# Patient Record
Sex: Male | Born: 1970 | Race: Black or African American | Hispanic: No | Marital: Married | Smoking: Never smoker
Health system: Southern US, Community
[De-identification: ages and names within clinical notes are randomized; demographics above are authoritative.]

## PROBLEM LIST (undated history)

## (undated) DIAGNOSIS — E785 Hyperlipidemia, unspecified: Secondary | ICD-10-CM

## (undated) DIAGNOSIS — I10 Essential (primary) hypertension: Secondary | ICD-10-CM

## (undated) HISTORY — DX: Hyperlipidemia, unspecified: E78.5

## (undated) HISTORY — DX: Essential (primary) hypertension: I10

---

## 2009-10-02 HISTORY — PX: LAPAROSCOPIC GASTRIC BANDING WITH HIATAL HERNIA REPAIR: SHX6351

## 2013-10-19 ENCOUNTER — Encounter (INDEPENDENT_AMBULATORY_CARE_PROVIDER_SITE_OTHER): Payer: Self-pay

## 2013-10-19 ENCOUNTER — Ambulatory Visit (INDEPENDENT_AMBULATORY_CARE_PROVIDER_SITE_OTHER): Payer: BC Managed Care – PPO | Admitting: General Surgery

## 2013-10-19 ENCOUNTER — Encounter (INDEPENDENT_AMBULATORY_CARE_PROVIDER_SITE_OTHER): Payer: Self-pay | Admitting: General Surgery

## 2013-10-19 VITALS — BP 128/72 | HR 70 | Temp 98.0°F | Resp 18 | Ht 72.0 in | Wt 262.2 lb

## 2013-10-19 DIAGNOSIS — Z9884 Bariatric surgery status: Secondary | ICD-10-CM

## 2013-10-19 DIAGNOSIS — Z6838 Body mass index (BMI) 38.0-38.9, adult: Secondary | ICD-10-CM

## 2013-10-19 DIAGNOSIS — I10 Essential (primary) hypertension: Secondary | ICD-10-CM

## 2013-10-19 DIAGNOSIS — E669 Obesity, unspecified: Secondary | ICD-10-CM

## 2013-10-19 DIAGNOSIS — Z4651 Encounter for fitting and adjustment of gastric lap band: Secondary | ICD-10-CM

## 2013-10-19 NOTE — Patient Instructions (Signed)
1. Stay on liquids for the next 2 days as you adapt to your new fill volume.  Then resume your previous diet. 2. Decreasing your carbohydrate intake will hasten you weight loss.  Rely more on proteins for your meals.  Avoid condiments that contain sweets such as Honey Mustard and sugary salad dressings.   3. Stay in the "green zone".  If you are regurgitating with meals, having night time reflux, and find yourself eating soft comfort foods (mashed potatoes, potato chips)...realize that you are developing "maladaptive eating".  You will not lose weight this way and may regain weight.  The GREEN ZONE is eating smaller portions and not regurgitating.  Hence we may need to withdraw fluid from your band. 4. Build exercise into your daily routine.  Walking is the best way to start but do something every day if you can.    Eating techniques 20-20-20 (30-30-30) 20 chews, 20 seconds between bites of food, 20 minutes to eat; sometimes you may need 30 chews, 30 seconds etc Use your nondominant hand to eat with Use a child/infant size utensil Try not to eat while watching TV Put fork down between bites of food Bites should be size of pinky finger

## 2013-10-20 NOTE — Progress Notes (Signed)
Patient ID: Antonio Thomas, male   DOB: April 15, 1971, 42 y.o.   MRN: [id number]  Chief Complaint  Patient presents with  . Bariatric Pre-op    Lap band    HPI Antonio Thomas is a 42 y.o. male.   HPI 42 year old African American male who underwent laparoscopic adjustable gastric band placement of a Realize band as well as a small hiatal hernia repair in November 2010 presents today for transfer of his bariatric care. He had a procedure done at [facility] by Dr. Clent Ridges. About a year later he states that he had a port problem and had to have a revision of his port. His last visit at his prior bariatric surgeon's office was in March 2013. His highest preoperative weight was 292 pounds. His weight at his last doctor's appointment in March 2013 261 pounds. His preoperative comorbidities included hypertension and hyperlipidemia. He states that overall he has been doing well. His lowest weight that he got down to after surgery was 247 pounds.  He states that currently he is not that active. He is not exercising on a regular basis. He is not taking a multivitamin. He denies any abdominal pain. He denies any reflux. He denies any regurgitation. He denies any nighttime cough. He states that the bites of food that he takes are about the size of a quarter. He only waits a few seconds between bites of food. He states that he can eat large portions of food. Past Medical History  Diagnosis Date  . Hypertension   . Hyperlipidemia     Past Surgical History  Procedure Laterality Date  . Laparoscopic gastric banding with hiatal hernia repair  10/02/09    Dr Clent Ridges; [place]; Realize band    Family History  Problem Relation Age of Onset  . Diabetes Mother   . Cancer Father     prostate  Renal failure in his brothers  Social History History  Substance Use Topics  . Smoking status: Never Smoker   . Smokeless tobacco: Not on file  . Alcohol Use: Not on file    No  Known Allergies  Current Outpatient Prescriptions  Medication Sig Dispense Refill  . diltiazem (CARDIZEM CD) 240 MG 24 hr capsule       . diltiazem (DILACOR XR) 240 MG 24 hr capsule       . simvastatin (ZOCOR) 20 MG tablet       . valsartan-hydrochlorothiazide (DIOVAN-HCT) 320-12.5 MG per tablet        No current facility-administered medications for this visit.    Review of Systems Review of Systems  Constitutional: Negative for fever, chills, appetite change and unexpected weight change.  HENT: Negative for congestion and trouble swallowing.   Eyes: Negative for visual disturbance.  Respiratory: Negative for chest tightness and shortness of breath.   Cardiovascular: Negative for chest pain and leg swelling.       No PND, no orthopnea, no DOE  Gastrointestinal: Negative for nausea, vomiting, abdominal pain and diarrhea.       See HPI; he does report some intermittent constipation. He generally has a bowel movement at least every other day but generally every day. He does have to strain some. He does have some occasional bloating.  Genitourinary: Negative for dysuria and hematuria.  Musculoskeletal: Negative.   Skin: Negative for rash.  Neurological: Negative for seizures and speech difficulty.  Hematological: Does not bruise/bleed easily.  Psychiatric/Behavioral: Negative for behavioral problems and confusion.  Blood pressure 128/72, pulse 70, temperature 98 F (36.7 C), resp. rate 18, height 6' (1.829 m), weight 262 lb 3.2 oz (118.933 kg).  Physical Exam Physical Exam  Constitutional: He is oriented to person, place, and time. He appears well-developed and well-nourished. No distress.  overweight  HENT:  Head: Normocephalic and atraumatic.  Right Ear: External ear normal.  Left Ear: External ear normal.  Eyes: Conjunctivae are normal. No scleral icterus.  Neck: Normal range of motion. Neck supple. No tracheal deviation present. No thyromegaly present.  Cardiovascular:  Normal rate, normal heart sounds and intact distal pulses.   Pulmonary/Chest: Effort normal and breath sounds normal. No respiratory distress. He has no wheezes.  Abdominal: Soft. He exhibits no distension. There is no tenderness. There is no rebound and no guarding.    Port is in the left midabdomen about 2 finger breaths below this transverse incision in the left upper quadrant near the lateral aspect of the incision  Musculoskeletal: Normal range of motion. He exhibits no edema and no tenderness.  Lymphadenopathy:    He has no cervical adenopathy.  Neurological: He is alert and oriented to person, place, and time. He exhibits normal muscle tone.  Skin: Skin is warm and dry. No rash noted. He is not diaphoretic. No erythema. No pallor.  Psychiatric: He has a normal mood and affect. His behavior is normal. Judgment and thought content normal.    Data Reviewed Labs from 08/10/13: Total cholesterol 180, triglycerides 101, HDL cholesterol 43, LDL cholesterol 117; total testosterone 162.7-low; hemoglobin A1c 5.9 patient was not fasting  Operative note from 10/02/2009-[place] [facility], Dr. Franki Cabot; laparoscopic placement of adjustable realize band and hiatal hernia repair  Office note from 01/26/2012-preoperative weight 292 pounds, weight at that visit 261 pounds; band adjustment performed under fluoroscopy-total 8 mL's of saline  Assessment    Obesity-BMI 35 Status post laparoscopic adjustable gastric band placement (realize band) with hiatal hernia repair at outside hospital Hypertension Hyperlipidemia-improved     Plan    He has maintained his weight for about a year and a half since his last followup appointment with his bariatric surgeon. However he is not down to his lowest weight after bariatric surgery. He states that his lowest weight after surgery was around 247. But after they revised his port he gained about 18 pounds back. We spent a large amount of time  discussing proper eating behaviors and techniques. He was given Agricultural engineer. We discussed the importance of taking a small bite of food, chewing 20-30 times, and waiting 20-30 seconds before taking the next bite of food.  We discussed the importance of activity and exercise. We discussed the importance of daily activity such as something simple as walking and setting goals to increase his daily step count.  Since he does not have adequate restriction and is hungry, I recommended an adjustment today.  After obtaining verbal consent, the abdominal wall was prepped with Chloraprep. The port was accessed with a Huber needle and 7cc of saline was aspirated and 0.5 cc of saline was added to give the patient an expected fill volume of 7.5 cc.  The patient was able to tolerate sips of water.   The patient was instructed to stay on liquids for at least 24 hours and then soft foods for day. Followup 6-8 weeks  Mary Sella. Andrey Campanile, MD, FACS General, Bariatric, & Minimally Invasive Surgery [facility] Surgery, [place]        [facility] M 10/20/2013, 9:26  AM    

## 2013-10-21 ENCOUNTER — Encounter (INDEPENDENT_AMBULATORY_CARE_PROVIDER_SITE_OTHER): Payer: Self-pay

## 2013-11-30 ENCOUNTER — Other Ambulatory Visit (INDEPENDENT_AMBULATORY_CARE_PROVIDER_SITE_OTHER): Payer: Self-pay | Admitting: General Surgery

## 2013-11-30 ENCOUNTER — Ambulatory Visit (INDEPENDENT_AMBULATORY_CARE_PROVIDER_SITE_OTHER): Payer: BC Managed Care – PPO | Admitting: General Surgery

## 2013-11-30 ENCOUNTER — Encounter (INDEPENDENT_AMBULATORY_CARE_PROVIDER_SITE_OTHER): Payer: Self-pay

## 2013-11-30 ENCOUNTER — Encounter (INDEPENDENT_AMBULATORY_CARE_PROVIDER_SITE_OTHER): Payer: Self-pay | Admitting: General Surgery

## 2013-11-30 VITALS — BP 127/86 | HR 83 | Temp 97.1°F | Resp 18 | Ht 72.0 in | Wt 266.4 lb

## 2013-11-30 DIAGNOSIS — Z9884 Bariatric surgery status: Secondary | ICD-10-CM

## 2013-11-30 DIAGNOSIS — E669 Obesity, unspecified: Secondary | ICD-10-CM

## 2013-11-30 DIAGNOSIS — Z4651 Encounter for fitting and adjustment of gastric lap band: Secondary | ICD-10-CM

## 2013-11-30 NOTE — Patient Instructions (Signed)
Eating techniques 20-20-20 (30-30-30) 20 chews, 20 seconds between bites of food, 20 minutes to eat; sometimes you may need 30 chews, 30 seconds etc Use your nondominant hand to eat with Use a child/infant size utensil Try not to eat while watching TV  1. Stay on liquids for the next 2 days as you adapt to your new fill volume.  Then resume your previous diet. 2. Decreasing your carbohydrate intake will hasten you weight loss.  Rely more on proteins for your meals.  Avoid condiments that contain sweets such as Honey Mustard and sugary salad dressings.   3. Stay in the "green zone".  If you are regurgitating with meals, having night time reflux, and find yourself eating soft comfort foods (mashed potatoes, potato chips)...realize that you are developing "maladaptive eating".  You will not lose weight this way and may regain weight.  The GREEN ZONE is eating smaller portions and not regurgitating.  Hence we may need to withdraw fluid from your band. 4. Build exercise into your daily routine.  Walking is the best way to start but do something every day if you can.  Try changing up the speed while on the treadmill

## 2013-11-30 NOTE — Progress Notes (Signed)
Subjective:     Patient ID: Antonio Thomas, male   DOB: 01-05-1971, 43 y.o.   MRN: [id number]  HPI 43 year old [place] American male comes in for followup. I initially met him on 10/19/2013 when he transferred his bariatric care to our office. He previously undergone a laparoscopic placement of an adjustable gastric band (realize) and hiatal hernia repair at [place] in November 2010. He went back to the operating room at least once for port revision. At his initial visit he had fallen off the wagon with respect to exercise. We also did an adjustment. He states that he can still eat large portions of food. He is hungry between meals. He denies any reflux. He denies any nighttime cough or wheezing. He denies regurgitation. He is now exercising 2-3 times a week at the local [employer]. He is walking 2 miles on a treadmill and doing some weightlifting.  Review of Systems 10 point ROS performed and all systems negative except for HPI    Objective:   Physical Exam BP 127/86  Pulse 83  Temp(Src) 97.1 F (36.2 C) (Temporal)  Resp 18  Ht 6' (1.829 m)  Wt 266 lb 6.4 oz (120.838 kg)  BMI 36.12 kg/m2  See LapBand flowsheet  Gen: alert, NAD, non-toxic appearing HEENT: normocephalic, atraumatic; pupils equal, no scleral icterus, neck supple, no lymphadenopathy Pulm: Lungs clear to auscultation, symmetric chest rise CV: regular rate and rhythm Abd: soft, nontender, nondistended. Well-healed trocar sites. No incisional hernia. Port is in right mid-abdomen Ext: no edema, normal, symmetric strength Neuro: nonfocal, sensation grossly intact Psych: appropriate, judgment normal     Assessment:     Obesity BMI 36 Hypertension  Hyperlipidemia    Plan:     He has gained 4.2 pounds since his initial visit. his preoperative weight was 292 pounds.  Since he is able to eat large portions of food and has ongoing hunger sensation between meals I recommended an adjustment  After obtaining verbal consent,  the abdominal wall was prepped with Chloraprep. The port was accessed with a Huber needle On the second attempt and 0.25 cc of saline was added to give the patient an expected fill volume of 7.75 cc.  The patient was able to tolerate sips of water.  He was instructed to stay on liquids for least 24 hour   I congratulated him on getting back into exercising. We discussed the importance of changing the intensity while exercising. We rediscussed proper eating techniques and behaviors.  I recommend getting an upper GI to evaluate his band anatomy. He'll be scheduled for an outpatient upper GI at his convenience. Followup 4-6 weeks with his sister  Leighton Ruff. Redmond Pulling, MD, FACS General, Bariatric, & Minimally Invasive Surgery [facility] Surgery, [place]

## 2013-12-01 ENCOUNTER — Telehealth (INDEPENDENT_AMBULATORY_CARE_PROVIDER_SITE_OTHER): Payer: Self-pay | Admitting: *Deleted

## 2013-12-01 NOTE — Telephone Encounter (Signed)
LMOM for pt to return my call.  I was calling to notify him of his appt for [facility] at [facility] on 12/07/13 with an arrival time of 8:15am.  He is to be NPO after midnight.

## 2013-12-06 ENCOUNTER — Other Ambulatory Visit (HOSPITAL_COMMUNITY): Payer: Self-pay

## 2013-12-06 NOTE — Telephone Encounter (Signed)
I spoke with pt and he states he never received a voicemail.  He states that he cannot go to the appt tomorrow so I provided him with AP phone number of [phone] and he will call to reschedule this appt.  He is agreeable with this appt at this time.

## 2013-12-07 ENCOUNTER — Other Ambulatory Visit (HOSPITAL_COMMUNITY): Payer: Self-pay

## 2014-01-12 ENCOUNTER — Encounter (INDEPENDENT_AMBULATORY_CARE_PROVIDER_SITE_OTHER): Payer: BC Managed Care – PPO

## 2014-06-29 ENCOUNTER — Ambulatory Visit (INDEPENDENT_AMBULATORY_CARE_PROVIDER_SITE_OTHER): Payer: BC Managed Care – PPO | Admitting: Physician Assistant

## 2014-06-29 ENCOUNTER — Encounter (INDEPENDENT_AMBULATORY_CARE_PROVIDER_SITE_OTHER): Payer: Self-pay

## 2014-06-29 VITALS — BP 120/80 | HR 76 | Temp 98.6°F | Resp 14 | Ht 72.0 in | Wt 261.0 lb

## 2014-06-29 DIAGNOSIS — Z4651 Encounter for fitting and adjustment of gastric lap band: Secondary | ICD-10-CM

## 2014-06-29 DIAGNOSIS — K219 Gastro-esophageal reflux disease without esophagitis: Secondary | ICD-10-CM

## 2014-06-29 NOTE — Progress Notes (Signed)
  HISTORY: Antonio Thomas is a 43 y.o.male who received an Realize band in November 2010 in [place]. He is here as transfer of care. The patient has lost five lbs since their last visit in January, and has lost 31 lbs since surgery. He arrives this morning complaining of persistent solid food dysphagia since his adjustment in January. He has no problems with liquids other than first thing in the morning, and this is only occasionally. He also has nocturnal reflux. He would like some fluid removed.  VITAL SIGNS: Filed Vitals:   06/29/14 0946  BP: 120/80  Pulse: 76  Temp: 98.6 F (37 C)  Resp: 14    PHYSICAL EXAM: Physical exam reveals a very well-appearing 43 y.o.male in no apparent distress Neurologic: Awake, alert, oriented Psych: Bright affect, conversant Respiratory: Breathing even and unlabored. No stridor or wheezing Abdomen: Soft, nontender, nondistended to palpation. Incisions well-healed. No incisional hernias. Port easily palpated. Extremities: Atraumatic, good range of motion.  ASSESMENT: 43 y.o.  male  s/p Realize band.   PLAN: The patient's port was accessed with a 20G Huber needle without difficulty. Clear fluid was aspirated and 0.75 mL saline was removed from the port to give a total predicted volume of 7 mL. The patient was advised to concentrate on healthy food choices and to avoid slider foods high in fats and carbohydrates. He drank water without difficulty. I have ordered an upper GI prior to his next visit in one month. A similar study was ordered in January but the patient cancelled.

## 2014-06-29 NOTE — Patient Instructions (Signed)
Return in one month. Obtain your xray as requested. Focus on good food choices as well as physical activity. Return sooner if you have an increase in hunger, portion sizes or weight. Return also for difficulty swallowing, night cough, reflux.

## 2014-08-03 ENCOUNTER — Encounter (INDEPENDENT_AMBULATORY_CARE_PROVIDER_SITE_OTHER): Payer: BC Managed Care – PPO

## 2014-08-03 ENCOUNTER — Other Ambulatory Visit: Payer: Self-pay

## 2014-08-10 ENCOUNTER — Ambulatory Visit
Admission: RE | Admit: 2014-08-10 | Discharge: 2014-08-10 | Disposition: A | Discharge status: Home / Self Care | Payer: BC Managed Care – PPO | Source: Ambulatory Visit | Attending: Physician Assistant | Admitting: Physician Assistant

## 2014-08-10 ENCOUNTER — Encounter (INDEPENDENT_AMBULATORY_CARE_PROVIDER_SITE_OTHER): Payer: BC Managed Care – PPO

## 2014-08-10 DIAGNOSIS — Z4651 Encounter for fitting and adjustment of gastric lap band: Secondary | ICD-10-CM

## 2014-08-10 DIAGNOSIS — K219 Gastro-esophageal reflux disease without esophagitis: Secondary | ICD-10-CM

## 2016-03-02 ENCOUNTER — Encounter (HOSPITAL_COMMUNITY): Payer: Self-pay | Admitting: *Deleted

## 2016-03-02 ENCOUNTER — Emergency Department (HOSPITAL_COMMUNITY)
Admission: EM | Admit: 2016-03-02 | Discharge: 2016-03-03 | Disposition: A | Discharge status: Home / Self Care | Payer: BLUE CROSS/BLUE SHIELD | Attending: Emergency Medicine | Admitting: Emergency Medicine

## 2016-03-02 DIAGNOSIS — I10 Essential (primary) hypertension: Secondary | ICD-10-CM | POA: Diagnosis not present

## 2016-03-02 DIAGNOSIS — Z79899 Other long term (current) drug therapy: Secondary | ICD-10-CM | POA: Diagnosis not present

## 2016-03-02 DIAGNOSIS — R109 Unspecified abdominal pain: Secondary | ICD-10-CM | POA: Diagnosis present

## 2016-03-02 DIAGNOSIS — A084 Viral intestinal infection, unspecified: Secondary | ICD-10-CM | POA: Insufficient documentation

## 2016-03-02 DIAGNOSIS — E785 Hyperlipidemia, unspecified: Secondary | ICD-10-CM | POA: Insufficient documentation

## 2016-03-02 LAB — CBC
HCT: 49.8 % (ref 39.0–52.0)
Hemoglobin: 16.4 g/dL (ref 13.0–17.0)
MCH: 29.1 pg (ref 26.0–34.0)
MCHC: 32.9 g/dL (ref 30.0–36.0)
MCV: 88.5 fL (ref 78.0–100.0)
PLATELETS: 249 10*3/uL (ref 150–400)
RBC: 5.63 MIL/uL (ref 4.22–5.81)
RDW: 13.7 % (ref 11.5–15.5)
WBC: 10.8 10*3/uL — ABNORMAL HIGH (ref 4.0–10.5)

## 2016-03-02 LAB — COMPREHENSIVE METABOLIC PANEL
ALK PHOS: 59 U/L (ref 38–126)
ALT: 19 U/L (ref 17–63)
AST: 22 U/L (ref 15–41)
Albumin: 4.9 g/dL (ref 3.5–5.0)
Anion gap: 16 — ABNORMAL HIGH (ref 5–15)
BUN: 19 mg/dL (ref 6–20)
CALCIUM: 10.1 mg/dL (ref 8.9–10.3)
CO2: 24 mmol/L (ref 22–32)
CREATININE: 2.4 mg/dL — AB (ref 0.61–1.24)
Chloride: 100 mmol/L — ABNORMAL LOW (ref 101–111)
GFR, EST AFRICAN AMERICAN: 36 mL/min — AB (ref >60)
GFR, EST NON AFRICAN AMERICAN: 31 mL/min — AB (ref >60)
Glucose, Bld: 129 mg/dL — ABNORMAL HIGH (ref 65–99)
Potassium: 3.6 mmol/L (ref 3.5–5.1)
Sodium: 140 mmol/L (ref 135–145)
TOTAL PROTEIN: 8.9 g/dL — AB (ref 6.5–8.1)
Total Bilirubin: 1.8 mg/dL — ABNORMAL HIGH (ref 0.3–1.2)

## 2016-03-02 LAB — URINALYSIS, ROUTINE W REFLEX MICROSCOPIC
GLUCOSE, UA: NEGATIVE mg/dL
Hgb urine dipstick: NEGATIVE
KETONES UR: 15 mg/dL — AB
Nitrite: NEGATIVE
Specific Gravity, Urine: 1.031 — ABNORMAL HIGH (ref 1.005–1.030)
pH: 5 (ref 5.0–8.0)

## 2016-03-02 LAB — URINE MICROSCOPIC-ADD ON

## 2016-03-02 LAB — LIPASE, BLOOD: Lipase: 28 U/L (ref 11–51)

## 2016-03-02 MED ORDER — DIATRIZOATE MEGLUMINE & SODIUM 66-10 % PO SOLN
15.0000 mL | ORAL | Status: AC
  Administered 2016-03-02: 15 mL via ORAL
  Filled 2016-03-02: qty 30

## 2016-03-02 MED ORDER — SODIUM CHLORIDE 0.9 % IV BOLUS (SEPSIS)
1000.0000 mL | Freq: Once | INTRAVENOUS | Status: AC
  Administered 2016-03-02: 1000 mL via INTRAVENOUS

## 2016-03-02 MED ORDER — DIATRIZOATE MEGLUMINE & SODIUM 66-10 % PO SOLN
ORAL | Status: AC
  Administered 2016-03-02: 15 mL via ORAL
  Filled 2016-03-02: qty 30

## 2016-03-02 MED ORDER — ONDANSETRON HCL 4 MG/2ML IJ SOLN
4.0000 mg | Freq: Once | INTRAMUSCULAR | Status: AC
  Administered 2016-03-02: 4 mg via INTRAVENOUS
  Filled 2016-03-02: qty 2

## 2016-03-02 NOTE — ED Provider Notes (Signed)
CSN: [id number]     Arrival date & time 03/02/16  2054 History   First MD Initiated Contact with Patient 03/02/16 2128     Chief Complaint  Patient presents with  . Abdominal Pain  . Emesis  . Diarrhea   HPI Comments: 45 year old male presents with acute onset of nausea, vomiting, diarrhea since 11 PM last night. He states that he went to [facility] and had a meal at about 8 PM started having symptoms soon after. Past medical history significant for lap band surgery with hiatal hernia repair in 2010. The abdominal pain is generalized, aching, intermittent. Patient has had 3-4 episodes of vomiting today and 6 episodes of diarrhea tonight. Last meal was 8 PM last night. Denies fever, chills, chest pain, shortness of breath, blood in his vomit, blood in his stool, dysuria. He was seen at an outside facility in [place], [place] tonight. Acute abdominal x-ray was suggestive of bowel obstruction.   Patient is a 45 y.o. male presenting with abdominal pain, vomiting, and diarrhea.  Abdominal Pain Associated symptoms: diarrhea and vomiting   Associated symptoms: no chest pain, no chills, no dysuria, no fever, no nausea and no shortness of breath   Emesis Associated symptoms: abdominal pain and diarrhea   Associated symptoms: no chills   Diarrhea Associated symptoms: abdominal pain and vomiting   Associated symptoms: no chills and no fever     Past Medical History  Diagnosis Date  . Hypertension   . Hyperlipidemia    Past Surgical History  Procedure Laterality Date  . Laparoscopic gastric banding with hiatal hernia repair  10/02/09    Dr Clent Ridges; [place]; Realize band   Family History  Problem Relation Age of Onset  . Diabetes Mother   . Cancer Father     prostate   Social History  Substance Use Topics  . Smoking status: Never Smoker   . Smokeless tobacco: Never Used  . Alcohol Use: Yes     Comment: ocassionally    Review of Systems  Constitutional: Negative for fever and  chills.  Respiratory: Negative for shortness of breath.   Cardiovascular: Negative for chest pain.  Gastrointestinal: Positive for vomiting, abdominal pain and diarrhea. Negative for nausea.  Genitourinary: Negative for dysuria.   Allergies  Review of patient's allergies indicates no known allergies.  Home Medications   Prior to Admission medications   Medication Sig Start Date End Date Taking? Authorizing Provider  diltiazem (CARDIZEM CD) 240 MG 24 hr capsule Take 240 mg by mouth daily.  10/04/13  Yes Historical Provider, MD  magnesium hydroxide (MILK OF MAGNESIA) 400 MG/5ML suspension Take 30 mLs by mouth daily as needed for mild constipation or indigestion.   Yes Historical Provider, MD  simethicone (MYLICON) 125 MG chewable tablet Chew 125 mg by mouth every 6 (six) hours as needed for flatulence.   Yes Historical Provider, MD  simvastatin (ZOCOR) 20 MG tablet Take 20 mg by mouth daily at 6 PM.  08/10/13  Yes Historical Provider, MD  valsartan-hydrochlorothiazide (DIOVAN-HCT) 320-12.5 MG per tablet Take 1 tablet by mouth daily.  10/06/13  Yes Historical Provider, MD   BP 111/62 mmHg  Pulse 103  Temp(Src) 98.5 F (36.9 C)  Resp 16  Wt 124.286 kg  SpO2 96%   Physical Exam  Constitutional: He is oriented to person, place, and time. He appears well-developed and well-nourished. No distress.  HENT:  Head: Normocephalic and atraumatic.  Eyes: Conjunctivae are normal. Pupils are equal, round, and reactive to  light. Right eye exhibits no discharge. Left eye exhibits no discharge. No scleral icterus.  Neck: Normal range of motion.  Cardiovascular: Normal rate and regular rhythm.  Exam reveals no gallop and no friction rub.   No murmur heard. Pulmonary/Chest: Effort normal and breath sounds normal. No respiratory distress. He has no wheezes. He has no rales. He exhibits no tenderness.  Abdominal: Soft. He exhibits no distension and no mass. There is tenderness. There is no rebound and no  guarding.  Generalized tenderness  Neurological: He is alert and oriented to person, place, and time.  Skin: Skin is warm and dry.  Psychiatric: He has a normal mood and affect.    ED Course  Procedures (including critical care time) Labs Review Labs Reviewed  COMPREHENSIVE METABOLIC PANEL - Abnormal; Notable for the following:    Chloride 100 (*)    Glucose, Bld 129 (*)    Creatinine, Ser 2.40 (*)    Total Protein 8.9 (*)    Total Bilirubin 1.8 (*)    GFR calc non Af Amer 31 (*)    GFR calc Af Amer 36 (*)    Anion gap 16 (*)    All other components within normal limits  CBC - Abnormal; Notable for the following:    WBC 10.8 (*)    All other components within normal limits  URINALYSIS, ROUTINE W REFLEX MICROSCOPIC (NOT AT [facility]) - Abnormal; Notable for the following:    Color, Urine ORANGE (*)    APPearance TURBID (*)    Specific Gravity, Urine 1.031 (*)    Bilirubin Urine MODERATE (*)    Ketones, ur 15 (*)    Protein, ur >300 (*)    Leukocytes, UA TRACE (*)    All other components within normal limits  URINE MICROSCOPIC-ADD ON - Abnormal; Notable for the following:    Squamous Epithelial / LPF 0-5 (*)    Bacteria, UA MANY (*)    Casts HYALINE CASTS (*)    Crystals CA OXALATE CRYSTALS (*)    All other components within normal limits  URINE CULTURE  LIPASE, BLOOD    Imaging Review No results found. I have personally reviewed and evaluated these images and lab results as part of my medical decision-making.   EKG Interpretation None      MDM   Final diagnoses:  None   45 year old male who presents with generalized abdominal pain, nausea, vomiting, diarrhea for the past day.  CBC shows mild leukocytosis. BMP is remarkable for serum creatinine 2.4. Patient states his baseline is 3. UA remarkable for ketones 15 and specific gravity of 1.03. He reports decreased urine output. IV fluids and Zofran given.   CT of abdomen pending. Patient handed off to ConsecoKelly Humes  PA-C. If CT is neg, most likely diagnosis is food poisoning vs gastroenteritis.     Bethel BornKelly Marie Dub Maclellan, PA-C 03/03/16 0106  Tilden FossaElizabeth Rees, MD 03/06/16 [phone]

## 2016-03-02 NOTE — ED Notes (Signed)
Patient presents stating last night after eating a chicken sandwich from [employer] about 11p he started with abd pain  Stared with vomiting and diarrhea later ona nd it has been coming an going since then

## 2016-03-03 ENCOUNTER — Encounter (HOSPITAL_COMMUNITY): Payer: Self-pay | Admitting: Radiology

## 2016-03-03 ENCOUNTER — Emergency Department (HOSPITAL_COMMUNITY): Payer: BLUE CROSS/BLUE SHIELD

## 2016-03-03 MED ORDER — ONDANSETRON HCL 4 MG PO TABS: 4.0000 mg | ORAL_TABLET | Freq: Four times a day (QID) | ORAL | Status: DC

## 2016-03-03 MED ORDER — MORPHINE SULFATE (PF) 4 MG/ML IV SOLN
4.0000 mg | Freq: Once | INTRAVENOUS | Status: AC
  Administered 2016-03-03: 4 mg via INTRAVENOUS
  Filled 2016-03-03: qty 1

## 2016-03-03 MED ORDER — DICYCLOMINE HCL 20 MG PO TABS: 20.0000 mg | ORAL_TABLET | Freq: Two times a day (BID) | ORAL | Status: AC | PRN

## 2016-03-03 MED ORDER — ONDANSETRON HCL 4 MG PO TABS: 4.0000 mg | ORAL_TABLET | Freq: Four times a day (QID) | ORAL | Status: AC

## 2016-03-03 MED ORDER — DICYCLOMINE HCL 20 MG PO TABS: 20.0000 mg | ORAL_TABLET | Freq: Two times a day (BID) | ORAL | Status: DC | PRN

## 2016-03-03 NOTE — ED Provider Notes (Signed)
2:32 AM Patient care assumed from Terance HartKelly Gekas, PA-C at change of shift. Patient pending a CT scan at this time to evaluate for cause of vomiting and diarrhea. Plan discussed with Jefm BryantGekas, PA-C which includes discharge if CT negative for emergent or surgical process.  CT findings reviewed which are consistent with likely enteritis. No evidence of obstruction. Will manage as outpatient with Bentyl and Zofran. PCP f/u advised and return precautions given. Patient discharged in satisfactory condition with no unaddressed concerns. Patient discharged in satisfactory condition.   Filed Vitals:   03/03/16 0100 03/03/16 0115 03/03/16 0130 03/03/16 0145  BP: 106/77 101/70 105/76 108/72  Pulse: 86 82 82 84  Temp:      Resp:      Weight:      SpO2: 96% 96% 95% 95%    Ct Abdomen Pelvis Wo Contrast  03/03/2016  CLINICAL DATA:  Post prandial pain, onset at 23:00. EXAM: CT ABDOMEN AND PELVIS WITHOUT CONTRAST TECHNIQUE: Multidetector CT imaging of the abdomen and pelvis was performed following the standard protocol without IV contrast. COMPARISON:  08/10/2014 FINDINGS: No significant abnormality is evident in the lower chest. Minor linear scarring or atelectasis is present in the lung bases. There is a lap band which appears satisfactorily positioned. Reservoir is in the left mid abdomen. Tubing appears intact although it is quite redundant, extending down to the pelvic midline. There are unremarkable unenhanced appearances of the liver, gallbladder, bile ducts, spleen, pancreas, adrenals and kidneys. Ureters and urinary bladder are unremarkable. Stomach is remarkable only for mild deformity from the lap band. Proximal and mid small bowel are mildly distended with fluid but oral contrast has moved on through into the normal appearing ileum. No transition point. There are a few slightly prominent mesenteric lymph nodes. Colon is unremarkable. The abdominal aorta is normal in caliber without atherosclerotic  calcification. No acute inflammatory changes are evident in the abdomen or pelvis. There is no extraluminal air. There is no ascites. No significant skeletal lesion is evident. Incidentally noted small fat containing umbilical hernia. IMPRESSION: 1. Generous volume of fluid throughout mildly distended proximal and mid small bowel. This may represent enteritis. No significant obstruction. No focal mass or focal inflammatory change. 2. Unremarkable appearances of the gastric lap band. Electronically Signed   By: Ellery Plunkaniel R [place] M.D.   On: 03/03/2016 02:22      Antony MaduraKelly Petula Rotolo, PA-C 03/03/16 [id number]  April Palumbo, MD 03/03/16 [phone]

## 2016-03-03 NOTE — Discharge Instructions (Signed)
Viral Gastroenteritis °Viral gastroenteritis is also known as stomach flu. This condition affects the stomach and intestinal tract. It can cause sudden diarrhea and vomiting. The illness typically lasts 3 to 8 days. Most people develop an immune response that eventually gets rid of the virus. While this natural response develops, the virus can make you quite ill. °CAUSES  °Many different viruses can cause gastroenteritis, such as rotavirus or noroviruses. You can catch one of these viruses by consuming contaminated food or water. You may also catch a virus by sharing utensils or other personal items with an infected person or by touching a contaminated surface. °SYMPTOMS  °The most common symptoms are diarrhea and vomiting. These problems can cause a severe loss of body fluids (dehydration) and a body salt (electrolyte) imbalance. Other symptoms may include: °· Fever. °· Headache. °· Fatigue. °· Abdominal pain. °DIAGNOSIS  °Your caregiver can usually diagnose viral gastroenteritis based on your symptoms and a physical exam. A stool sample may also be taken to test for the presence of viruses or other infections. °TREATMENT  °This illness typically goes away on its own. Treatments are aimed at rehydration. The most serious cases of viral gastroenteritis involve vomiting so severely that you are not able to keep fluids down. In these cases, fluids must be given through an intravenous line (IV). °HOME CARE INSTRUCTIONS  °· Drink enough fluids to keep your urine clear or pale yellow. Drink small amounts of fluids frequently and increase the amounts as tolerated. °· Ask your caregiver for specific rehydration instructions. °· Avoid: °¨ Foods high in sugar. °¨ Alcohol. °¨ Carbonated drinks. °¨ Tobacco. °¨ Juice. °¨ Caffeine drinks. °¨ Extremely hot or cold fluids. °¨ Fatty, greasy foods. °¨ Too much intake of anything at one time. °¨ Dairy products until 24 to 48 hours after diarrhea stops. °· You may consume probiotics.  Probiotics are active cultures of beneficial bacteria. They may lessen the amount and number of diarrheal stools in adults. Probiotics can be found in yogurt with active cultures and in supplements. °· Wash your hands well to avoid spreading the virus. °· Only take over-the-counter or prescription medicines for pain, discomfort, or fever as directed by your caregiver. Do not give aspirin to children. Antidiarrheal medicines are not recommended. °· Ask your caregiver if you should continue to take your regular prescribed and over-the-counter medicines. °· Keep all follow-up appointments as directed by your caregiver. °SEEK IMMEDIATE MEDICAL CARE IF:  °· You are unable to keep fluids down. °· You do not urinate at least once every 6 to 8 hours. °· You develop shortness of breath. °· You notice blood in your stool or vomit. This may look like coffee grounds. °· You have abdominal pain that increases or is concentrated in one small area (localized). °· You have persistent vomiting or diarrhea. °· You have a fever. °· The patient is a child younger than 3 months, and he or she has a fever. °· The patient is a child older than 3 months, and he or she has a fever and persistent symptoms. °· The patient is a child older than 3 months, and he or she has a fever and symptoms suddenly get worse. °· The patient is a baby, and he or she has no tears when crying. °MAKE SURE YOU:  °· Understand these instructions. °· Will watch your condition. °· Will get help right away if you are not doing well or get worse. °  °This information is not intended to replace   advice given to you by your health care provider. Make sure you discuss any questions you have with your health care provider. °  °Document Released: 10/20/2005 Document Revised: 01/12/2012 Document Reviewed: 08/06/2011 °[employer] Interactive Patient Education ©2016 [employer] ° °Food Choices to Help Relieve Diarrhea, Adult °When you have diarrhea, the foods you eat and your  eating habits are very important. Choosing the right foods and drinks can help relieve diarrhea. Also, because diarrhea can last up to 7 days, you need to replace lost fluids and electrolytes (such as sodium, potassium, and chloride) in order to help prevent dehydration.  °WHAT GENERAL GUIDELINES DO I NEED TO FOLLOW? °· Slowly drink 1 cup (8 oz) of fluid for each episode of diarrhea. If you are getting enough fluid, your urine will be clear or pale yellow. °· Eat starchy foods. Some good choices include white rice, white toast, pasta, low-fiber cereal, baked potatoes (without the skin), saltine crackers, and bagels. °· Avoid large servings of any cooked vegetables. °· Limit fruit to two servings per day. A serving is ½ cup or 1 small piece. °· Choose foods with less than 2 g of fiber per serving. °· Limit fats to less than 8 tsp (38 g) per day. °· Avoid fried foods. °· Eat foods that have probiotics in them. Probiotics can be found in certain dairy products. °· Avoid foods and beverages that may increase the speed at which food moves through the stomach and intestines (gastrointestinal tract). Things to avoid include: °¨ High-fiber foods, such as dried fruit, raw fruits and vegetables, nuts, seeds, and whole grain foods. °¨ Spicy foods and high-fat foods. °¨ Foods and beverages sweetened with high-fructose corn syrup, honey, or sugar alcohols such as xylitol, sorbitol, and mannitol. °WHAT FOODS ARE RECOMMENDED? °Grains °White rice. White, French, or pita breads (fresh or toasted), including plain rolls, buns, or bagels. White pasta. Saltine, soda, or graham crackers. Pretzels. Low-fiber cereal. Cooked cereals made with water (such as cornmeal, farina, or cream cereals). Plain muffins. Matzo. Melba toast. Zwieback.  °Vegetables °Potatoes (without the skin). Strained tomato and vegetable juices. Most well-cooked and canned vegetables without seeds. Tender lettuce. °Fruits °Cooked or canned applesauce, apricots,  cherries, fruit cocktail, grapefruit, peaches, pears, or plums. Fresh bananas, apples without skin, cherries, grapes, cantaloupe, grapefruit, peaches, oranges, or plums.  °Meat and Other Protein Products °Baked or boiled chicken. Eggs. Tofu. Fish. Seafood. Smooth peanut butter. Ground or well-cooked tender beef, ham, veal, lamb, pork, or poultry.  °Dairy °Plain yogurt, kefir, and unsweetened liquid yogurt. Lactose-free milk, buttermilk, or soy milk. Plain hard cheese. °Beverages °Sport drinks. Clear broths. Diluted fruit juices (except prune). Regular, caffeine-free sodas such as ginger ale. Water. Decaffeinated teas. Oral rehydration solutions. Sugar-free beverages not sweetened with sugar alcohols. °Other °Bouillon, broth, or soups made from recommended foods.  °The items listed above may not be a complete list of recommended foods or beverages. Contact your dietitian for more options. °WHAT FOODS ARE NOT RECOMMENDED? °Grains °Whole grain, whole wheat, bran, or rye breads, rolls, pastas, crackers, and cereals. Wild or brown rice. Cereals that contain more than 2 g of fiber per serving. Corn tortillas or taco shells. Cooked or dry oatmeal. Granola. Popcorn. °Vegetables °Raw vegetables. Cabbage, broccoli, Brussels sprouts, artichokes, baked beans, beet greens, corn, kale, legumes, peas, sweet potatoes, and yams. Potato skins. Cooked spinach and cabbage. °Fruits °Dried fruit, including raisins and dates. Raw fruits. Stewed or dried prunes. Fresh apples with skin, apricots, mangoes, pears, raspberries, and strawberries.  °Meat   and Other Protein Products °Chunky peanut butter. Nuts and seeds. Beans and lentils. Bacon.  °Dairy °High-fat cheeses. Milk, chocolate milk, and beverages made with milk, such as milk shakes. Cream. Ice cream. °Sweets and Desserts °Sweet rolls, doughnuts, and sweet breads. Pancakes and waffles. °Fats and Oils °Butter. Cream sauces. Margarine. Salad oils. Plain salad dressings. Olives. Avocados.    °Beverages °Caffeinated beverages (such as coffee, tea, soda, or energy drinks). Alcoholic beverages. Fruit juices with pulp. Prune juice. Soft drinks sweetened with high-fructose corn syrup or sugar alcohols. °Other °Coconut. Hot sauce. Chili powder. Mayonnaise. Gravy. Cream-based or milk-based soups.  °The items listed above may not be a complete list of foods and beverages to avoid. Contact your dietitian for more information. °WHAT SHOULD I DO IF I BECOME DEHYDRATED? °Diarrhea can sometimes lead to dehydration. Signs of dehydration include dark urine and dry mouth and skin. If you think you are dehydrated, you should rehydrate with an oral rehydration solution. These solutions can be purchased at pharmacies, retail stores, or online.  °Drink ½-1 cup (120-240 mL) of oral rehydration solution each time you have an episode of diarrhea. If drinking this amount makes your diarrhea worse, try drinking smaller amounts more often. For example, drink 1-3 tsp (5-15 mL) every 5-10 minutes.  °A general rule for staying hydrated is to drink 1½-2 L of fluid per day. Talk to your health care provider about the specific amount you should be drinking each day. Drink enough fluids to keep your urine clear or pale yellow. °  °This information is not intended to replace advice given to you by your health care provider. Make sure you discuss any questions you have with your health care provider. °  °Document Released: 01/10/2004 Document Revised: 11/10/2014 Document Reviewed: 09/12/2013 °[employer]vier Interactive Patient Education ©2016 [employer] ° °

## 2016-03-04 LAB — URINE CULTURE: Culture: NO GROWTH

## 2017-06-21 IMAGING — CT CT ABD-PELV W/O CM
2 of 4 series · 16 of 46 positions shown, 18 images · non-contrast
Comparison: 08/10/2014

CLINICAL DATA: Post prandial pain, onset at [DATE].

EXAM:
CT ABDOMEN AND PELVIS WITHOUT CONTRAST
TECHNIQUE: Multidetector CT imaging of the abdomen and pelvis was performed
following the standard protocol without IV contrast.

[Series 2: a/p w/o 5mm · axial · non-contrast · 0.80mm/px · z∈[+732,+1237]mm · 13 of 111 slices shown, 15 images]
[im 5/111  soft-tissue]
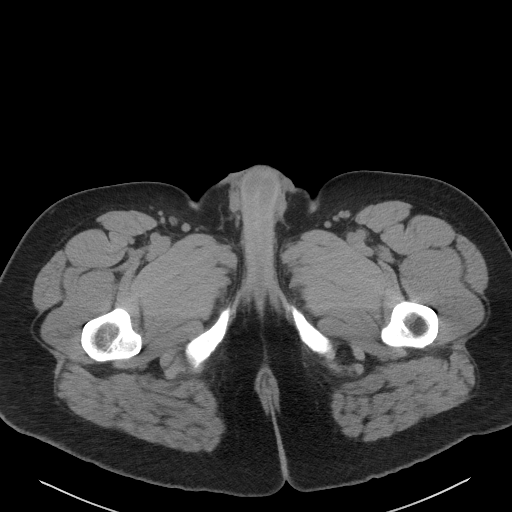
[im 5/111  bone]
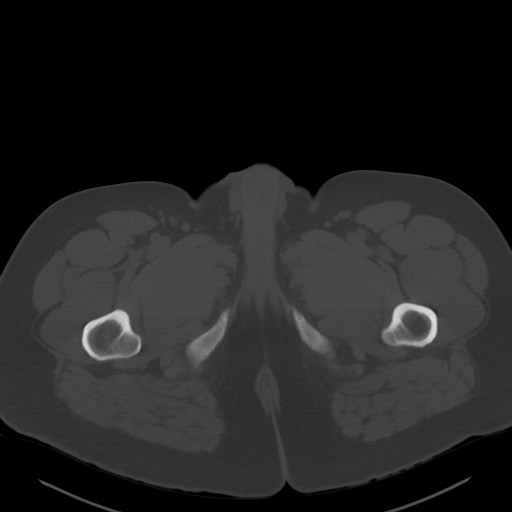
[im 14/111  soft-tissue]
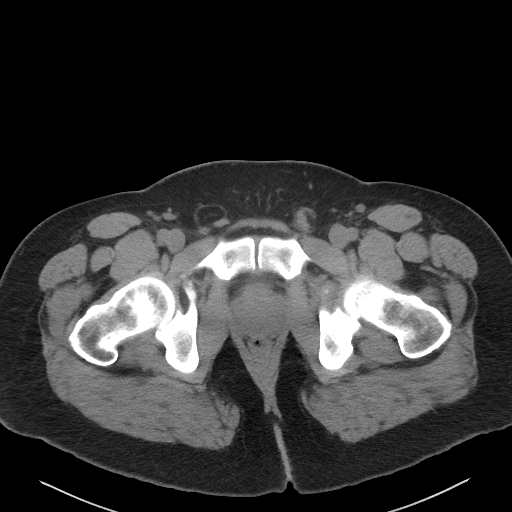
[im 23/111  soft-tissue]
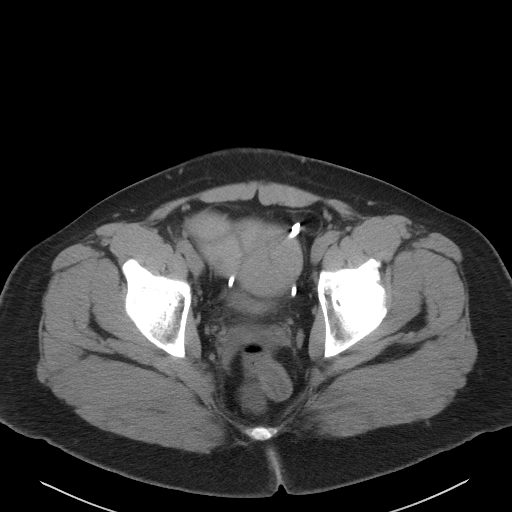
[im 33/111  soft-tissue]
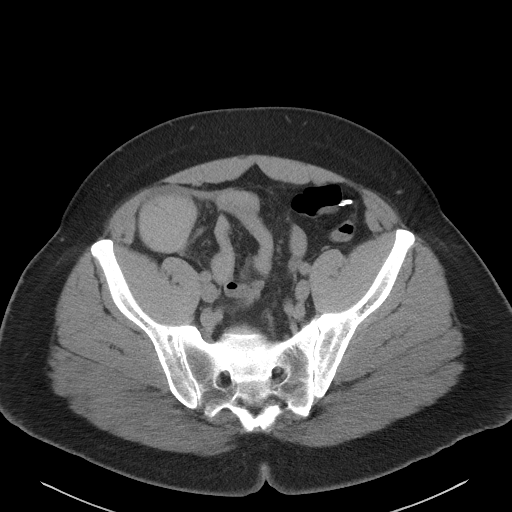
[im 37/111  soft-tissue]
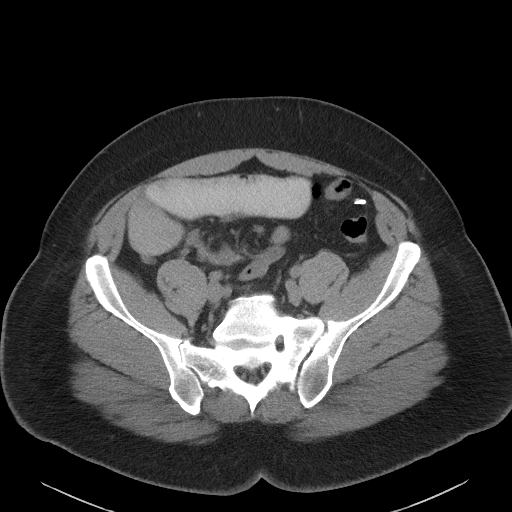
[im 46/111  soft-tissue]
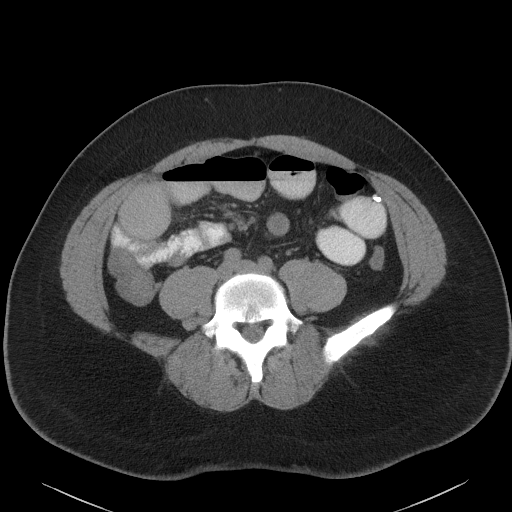
[im 56/111  soft-tissue]
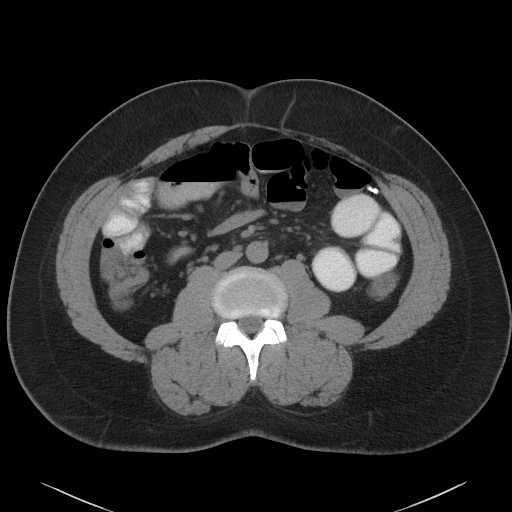
[im 65/111  soft-tissue]
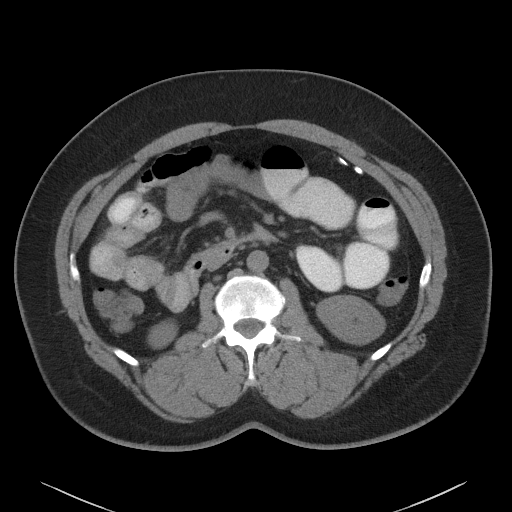
[im 74/111  soft-tissue]
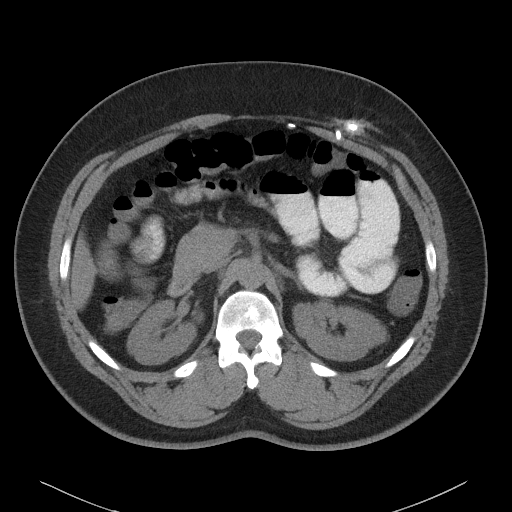
[im 74/111  bone]
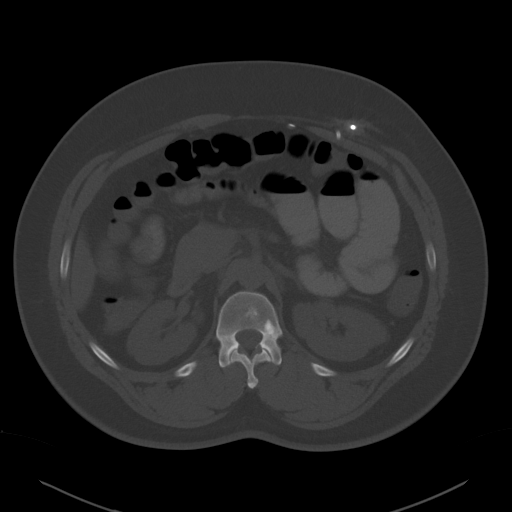
[im 78/111  soft-tissue]
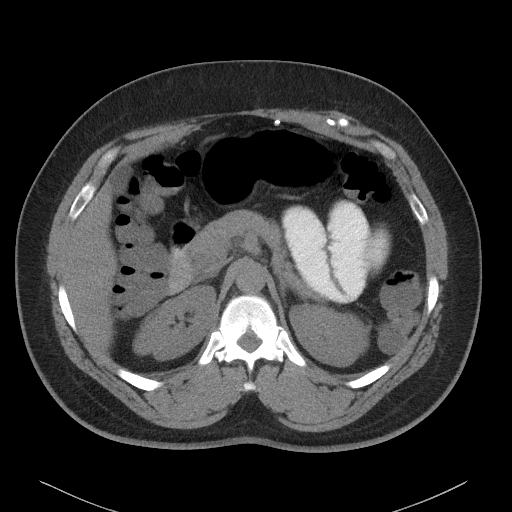
[im 88/111  soft-tissue]
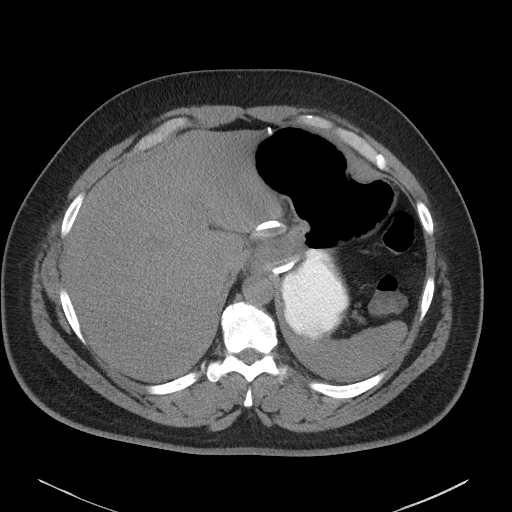
[im 97/111  soft-tissue]
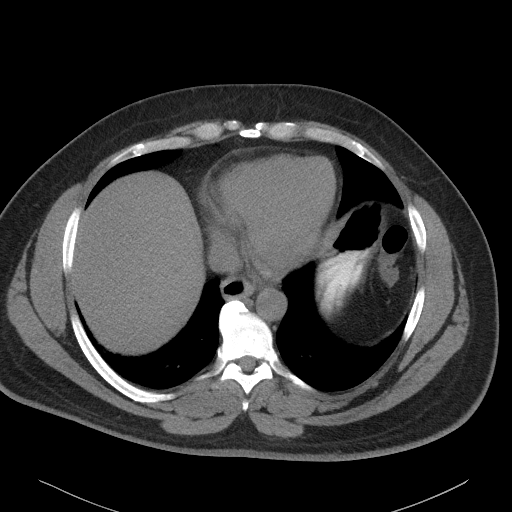
[im 106/111  soft-tissue]
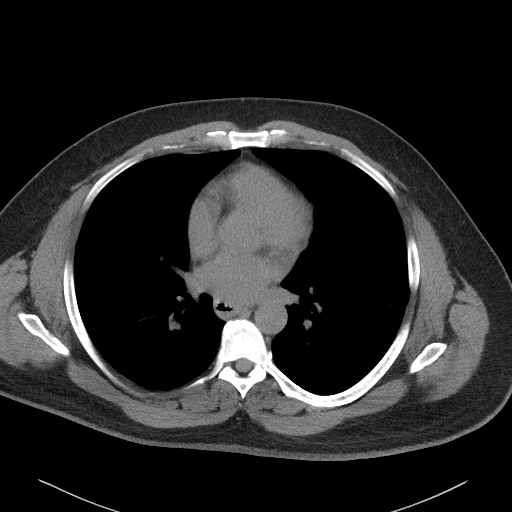

[Series 5: a/p w/o cor · coronal · non-contrast · 0.88mm/px · 3 of 149 slices shown]
[im 50/149  soft-tissue]
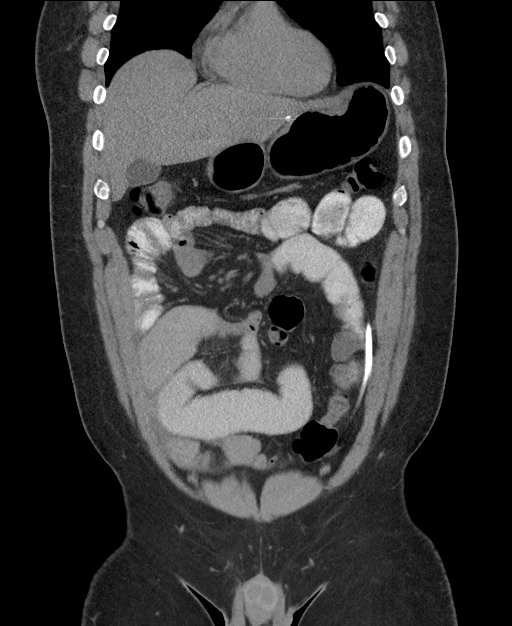
[im 66/149  soft-tissue]
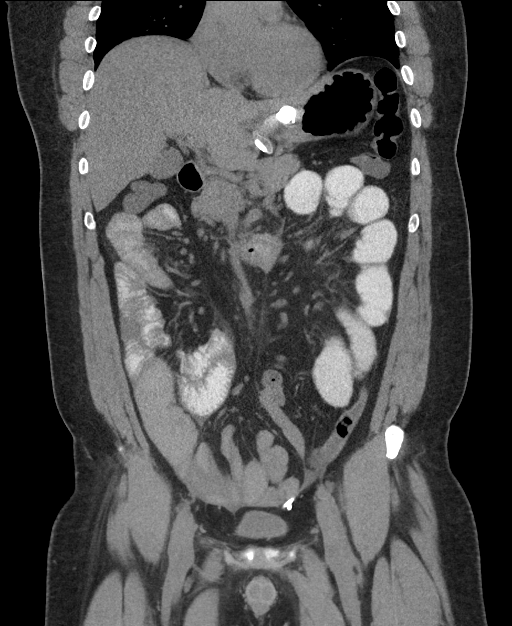
[im 83/149  soft-tissue]
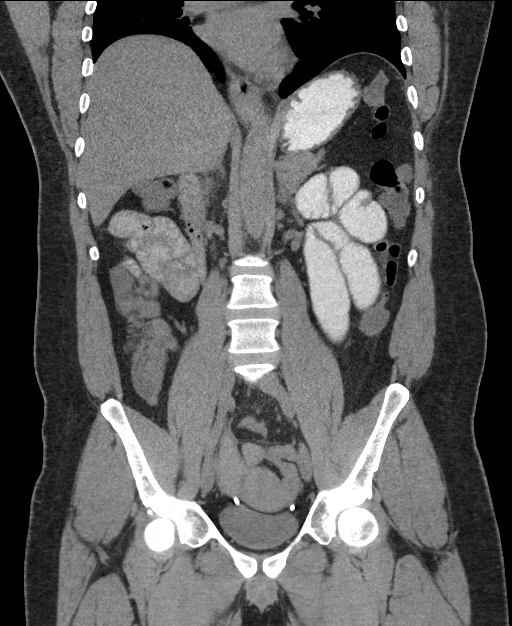

[16 of 46 positions shown; findings below may reference images not displayed]

FINDINGS: No significant abnormality is evident in the lower chest. Minor
linear scarring or atelectasis is present in the lung bases.

There is a lap band which appears satisfactorily positioned.
Reservoir is in the left mid abdomen. Tubing appears intact although
it is quite redundant, extending down to the pelvic midline.

There are unremarkable unenhanced appearances of the liver,
gallbladder, bile ducts, spleen, pancreas, adrenals and kidneys.
Ureters and urinary bladder are unremarkable.

Stomach is remarkable only for mild deformity from the lap band.
Proximal and mid small bowel are mildly distended with fluid but
oral contrast has moved on through into the normal appearing ileum.
No transition point. There are a few slightly prominent mesenteric
lymph nodes. Colon is unremarkable.

The abdominal aorta is normal in caliber without atherosclerotic
calcification.

No acute inflammatory changes are evident in the abdomen or pelvis.
There is no extraluminal air. There is no ascites.

No significant skeletal lesion is evident. Incidentally noted small
fat containing umbilical hernia.
IMPRESSION: 1. Generous volume of fluid throughout mildly distended proximal and
mid small bowel. This may represent enteritis. No significant
obstruction. No focal mass or focal inflammatory change.
2. Unremarkable appearances of the gastric lap band.
